# Patient Record
Sex: Male | Born: 1995 | Race: White | Hispanic: Yes | Marital: Single | Smoking: Current some day smoker
Health system: Southern US, Community
[De-identification: ages and names within clinical notes are randomized; demographics above are authoritative.]

---
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
## 2017-04-12 ENCOUNTER — Emergency Department: Payer: Medicaid Other

## 2017-04-12 ENCOUNTER — Encounter: Payer: Self-pay | Admitting: Emergency Medicine

## 2017-04-12 DIAGNOSIS — Y929 Unspecified place or not applicable: Secondary | ICD-10-CM | POA: Insufficient documentation

## 2017-04-12 DIAGNOSIS — Z5321 Procedure and treatment not carried out due to patient leaving prior to being seen by health care provider: Secondary | ICD-10-CM | POA: Diagnosis not present

## 2017-04-12 DIAGNOSIS — Y999 Unspecified external cause status: Secondary | ICD-10-CM | POA: Insufficient documentation

## 2017-04-12 DIAGNOSIS — S0993XA Unspecified injury of face, initial encounter: Secondary | ICD-10-CM | POA: Diagnosis present

## 2017-04-12 DIAGNOSIS — Y939 Activity, unspecified: Secondary | ICD-10-CM | POA: Insufficient documentation
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
## 2017-04-13 ENCOUNTER — Emergency Department
Admission: EM | Admit: 2017-04-13 | Discharge: 2017-04-13 | Discharge status: Against Medical Advice | Payer: Medicaid Other | Attending: Emergency Medicine | Admitting: Emergency Medicine

## 2018-02-12 ENCOUNTER — Encounter (HOSPITAL_COMMUNITY): Payer: Self-pay | Admitting: Emergency Medicine

## 2018-02-12 ENCOUNTER — Emergency Department (HOSPITAL_COMMUNITY)
Admission: EM | Admit: 2018-02-12 | Discharge: 2018-02-12 | Disposition: A | Discharge status: Home / Self Care | Payer: Self-pay | Attending: Emergency Medicine | Admitting: Emergency Medicine

## 2018-02-12 DIAGNOSIS — Y929 Unspecified place or not applicable: Secondary | ICD-10-CM | POA: Insufficient documentation

## 2018-02-12 DIAGNOSIS — Y939 Activity, unspecified: Secondary | ICD-10-CM | POA: Insufficient documentation

## 2018-02-12 DIAGNOSIS — F10129 Alcohol abuse with intoxication, unspecified: Secondary | ICD-10-CM | POA: Insufficient documentation

## 2018-02-12 DIAGNOSIS — Z5321 Procedure and treatment not carried out due to patient leaving prior to being seen by health care provider: Secondary | ICD-10-CM | POA: Insufficient documentation

## 2018-02-12 DIAGNOSIS — Y998 Other external cause status: Secondary | ICD-10-CM | POA: Insufficient documentation

## 2018-02-12 DIAGNOSIS — S0990XA Unspecified injury of head, initial encounter: Secondary | ICD-10-CM | POA: Insufficient documentation

## 2018-02-12 DIAGNOSIS — W01198A Fall on same level from slipping, tripping and stumbling with subsequent striking against other object, initial encounter: Secondary | ICD-10-CM | POA: Insufficient documentation

## 2018-02-12 LAB — CBC WITH DIFFERENTIAL/PLATELET
Basophils Absolute: 0 10*3/uL (ref 0.0–0.1)
Basophils Relative: 0 %
EOS PCT: 1 %
Eosinophils Absolute: 0.1 10*3/uL (ref 0.0–0.7)
HEMATOCRIT: 44.8 % (ref 39.0–52.0)
Hemoglobin: 15.9 g/dL (ref 13.0–17.0)
LYMPHS ABS: 2.1 10*3/uL (ref 0.7–4.0)
LYMPHS PCT: 21 %
MCH: 31.3 pg (ref 26.0–34.0)
MCHC: 35.5 g/dL (ref 30.0–36.0)
MCV: 88.2 fL (ref 78.0–100.0)
MONO ABS: 0.3 10*3/uL (ref 0.1–1.0)
Monocytes Relative: 3 %
NEUTROS ABS: 7.4 10*3/uL (ref 1.7–7.7)
Neutrophils Relative %: 75 %
PLATELETS: 229 10*3/uL (ref 150–400)
RBC: 5.08 MIL/uL (ref 4.22–5.81)
RDW: 12.7 % (ref 11.5–15.5)
WBC: 9.8 10*3/uL (ref 4.0–10.5)

## 2018-02-12 LAB — COMPREHENSIVE METABOLIC PANEL
ALT: 16 U/L — AB (ref 17–63)
AST: 20 U/L (ref 15–41)
Albumin: 4.8 g/dL (ref 3.5–5.0)
Alkaline Phosphatase: 60 U/L (ref 38–126)
Anion gap: 13 (ref 5–15)
BILIRUBIN TOTAL: 0.7 mg/dL (ref 0.3–1.2)
BUN: 8 mg/dL (ref 6–20)
CALCIUM: 9.6 mg/dL (ref 8.9–10.3)
CHLORIDE: 104 mmol/L (ref 101–111)
CO2: 24 mmol/L (ref 22–32)
CREATININE: 0.93 mg/dL (ref 0.61–1.24)
Glucose, Bld: 113 mg/dL — ABNORMAL HIGH (ref 65–99)
Potassium: 4.2 mmol/L (ref 3.5–5.1)
Sodium: 141 mmol/L (ref 135–145)
TOTAL PROTEIN: 8 g/dL (ref 6.5–8.1)

## 2018-02-12 LAB — ETHANOL: ALCOHOL ETHYL (B): 174 mg/dL — AB (ref <10)

## 2018-02-12 NOTE — ED Notes (Signed)
Called for pt x 3, unable to locate in the waiting room

## 2018-02-12 NOTE — ED Triage Notes (Signed)
Patient reports heavy ETOH intake this evening , fell and hit his head against the ground with no LOC , alert and oriented at arrival / denies pain .

## 2018-02-12 NOTE — ED Notes (Signed)
02/12/2018, Attempted follow-up call.  No answer.  

## 2019-06-17 ENCOUNTER — Other Ambulatory Visit: Payer: Self-pay

## 2019-06-17 DIAGNOSIS — Z20822 Contact with and (suspected) exposure to covid-19: Secondary | ICD-10-CM

## 2019-06-19 LAB — NOVEL CORONAVIRUS, NAA: SARS-CoV-2, NAA: NOT DETECTED

## 2020-10-22 ENCOUNTER — Emergency Department (HOSPITAL_COMMUNITY)
Admission: EM | Admit: 2020-10-22 | Discharge: 2020-10-22 | Disposition: A | Discharge status: Home / Self Care | Payer: Self-pay | Attending: Emergency Medicine | Admitting: Emergency Medicine

## 2020-10-22 ENCOUNTER — Emergency Department (HOSPITAL_COMMUNITY): Payer: Self-pay

## 2020-10-22 ENCOUNTER — Encounter (HOSPITAL_COMMUNITY): Payer: Self-pay

## 2020-10-22 ENCOUNTER — Other Ambulatory Visit: Payer: Self-pay

## 2020-10-22 DIAGNOSIS — T07XXXA Unspecified multiple injuries, initial encounter: Secondary | ICD-10-CM

## 2020-10-22 DIAGNOSIS — Y92511 Restaurant or cafe as the place of occurrence of the external cause: Secondary | ICD-10-CM | POA: Insufficient documentation

## 2020-10-22 DIAGNOSIS — Z23 Encounter for immunization: Secondary | ICD-10-CM | POA: Insufficient documentation

## 2020-10-22 DIAGNOSIS — F17200 Nicotine dependence, unspecified, uncomplicated: Secondary | ICD-10-CM | POA: Insufficient documentation

## 2020-10-22 DIAGNOSIS — S0231XA Fracture of orbital floor, right side, initial encounter for closed fracture: Secondary | ICD-10-CM | POA: Insufficient documentation

## 2020-10-22 DIAGNOSIS — S0181XA Laceration without foreign body of other part of head, initial encounter: Secondary | ICD-10-CM | POA: Insufficient documentation

## 2020-10-22 MED ORDER — TETANUS-DIPHTH-ACELL PERTUSSIS 5-2.5-18.5 LF-MCG/0.5 IM SUSY
0.5000 mL | PREFILLED_SYRINGE | Freq: Once | INTRAMUSCULAR | Status: AC
  Administered 2020-10-22: 04:00:00 0.5 mL via INTRAMUSCULAR
  Filled 2020-10-22: qty 0.5
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
## 2021-03-09 ENCOUNTER — Emergency Department: Payer: No Typology Code available for payment source

## 2021-03-09 ENCOUNTER — Emergency Department
Admission: EM | Admit: 2021-03-09 | Discharge: 2021-03-09 | Disposition: A | Discharge status: Home / Self Care | Payer: No Typology Code available for payment source | Attending: Emergency Medicine | Admitting: Emergency Medicine

## 2021-03-09 DIAGNOSIS — S8001XA Contusion of right knee, initial encounter: Secondary | ICD-10-CM

## 2021-03-09 DIAGNOSIS — Y92410 Unspecified street and highway as the place of occurrence of the external cause: Secondary | ICD-10-CM | POA: Insufficient documentation

## 2021-03-09 DIAGNOSIS — M25561 Pain in right knee: Secondary | ICD-10-CM

## 2021-03-09 DIAGNOSIS — M25461 Effusion, right knee: Secondary | ICD-10-CM | POA: Insufficient documentation

## 2021-03-09 DIAGNOSIS — F17200 Nicotine dependence, unspecified, uncomplicated: Secondary | ICD-10-CM | POA: Diagnosis not present

## 2021-03-09 MED ORDER — HYDROCODONE-ACETAMINOPHEN 5-325 MG PO TABS: 1.0000 | ORAL_TABLET | Freq: Four times a day (QID) | ORAL | 0 refills | Status: AC | PRN

## 2021-03-09 MED ORDER — IBUPROFEN 800 MG PO TABS: 800.0000 mg | ORAL_TABLET | Freq: Three times a day (TID) | ORAL | 0 refills | Status: AC | PRN

## 2021-03-09 NOTE — ED Notes (Signed)
Ace wrap applied to right leg.  Crutches given to patients sister.  Instructions to sister as patient was unable to receive the information at this time.

## 2021-03-09 NOTE — Discharge Instructions (Addendum)
1.  You may take pain medicines as needed (Motrin/Norco #15). 2.  Wear Ace wrap as needed for comfort while awake.  Use crutches to ambulate. 3.  Elevate affected areas and apply ice pack several times daily to reduce swelling. 4.  Return to the ER for worsening symptoms, persistent vomiting, lethargy, difficulty breathing or other concerns.
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
